# Patient Record
Sex: Male | Born: 2000 | Race: Black or African American | Hispanic: No | Marital: Single | Smoking: Current every day smoker
Health system: Southern US, Community
[De-identification: ages and names within clinical notes are randomized; demographics above are authoritative.]

## PROBLEM LIST (undated history)

## (undated) DIAGNOSIS — T07XXXA Unspecified multiple injuries, initial encounter: Secondary | ICD-10-CM

## (undated) DIAGNOSIS — S82842A Displaced bimalleolar fracture of left lower leg, initial encounter for closed fracture: Secondary | ICD-10-CM

---
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
## 2020-04-24 ENCOUNTER — Ambulatory Visit: Payer: 59 | Admitting: Neurology

## 2020-04-24 ENCOUNTER — Encounter: Payer: Self-pay | Admitting: Neurology

## 2020-04-24 NOTE — Progress Notes (Deleted)
[name]: [place] [name] DOB: 15-Mar-2001  REASON FOR VISIT: follow up HISTORY FROM: [name]  HISTORY OF PRESENT ILLNESS: Today 04/24/20  Nicholas [name] is an 19 year old male with history of single seizure-like event in November 2020.  EEG was normal.  MRI of Nicholas brain has yet to be completed.  HISTORY 10/22/2019 Dr. Anne Hahn: Nicholas [name] is an 19 year old left-handed black male with a history of an event that occurred on 08 October 2019.  Nicholas [name] had gotten up out of bed around 4 AM to use Nicholas bathroom, he was washing his hands afterwards and then suddenly blacked out.  His parents were at home, they heard him hit Nicholas floor and they ran in seconds after Nicholas fall.  They observed that [place] [name] was stiffened, his arms were straight and elevated, then Nicholas [name] went into a jerking event with arms.  N[place]olas [name] did not bite his tongue or lose control of Nicholas bowels or Nicholas bladder.  [place]olas [name] was not responding for about 5 minutes, he then suddenly jerked awake and started repetitively saying that he was "good".  Nicholas [name] has never had any blackout events previously.  He denies any prior history of a closed head injury or loss of consciousness.  There is no family history of seizures.  Nicholas [name] denies any headaches, vision changes, speech or swallowing problems.  N[place]olas [name] has not had any numbness or weakness of Nicholas face, arms, legs.  He denies balance issues or difficulty controlling Nicholas bowels or Nicholas bladder.  He went to [place] emergency room and a CT scan of Nicholas brain was unremarkable.  A urine drug screen was not done.  Nicholas [name] denies use of cocaine, he does use marijuana and he denies excessive drinking.  He is sent to this office for further evaluation.  REVIEW OF SYSTEMS: Out of a complete 14 system review of symptoms, Nicholas [name] complains only of Nicholas following symptoms, and all other reviewed systems are negative.  ALLERGIES: No Known Allergies  HOME MEDICATIONS: No  outpatient medications prior to visit.   No facility-administered medications prior to visit.    PAST MEDICAL HISTORY: Past Medical History:  Diagnosis Date  . Abrasions of multiple sites 05/28/2015   left arm, left knee  . Closed bimalleolar fracture of left ankle 05/28/2015  . Syncope and collapse 10/22/2019    PAST SURGICAL HISTORY: Past Surgical History:  Procedure Laterality Date  . ORIF ANKLE FRACTURE Left 06/02/2015   Procedure: OPEN REDUCTION INTERNAL FIXATION (ORIF) LEFT ANKLE FRACTURE;  Surgeon: Sheral Apley, MD;  Location: [facility];  Service: Orthopedics;  Laterality: Left;  ANESTHESIA:  GENERAL, ANKLE BLOCK    FAMILY HISTORY: Family History  Problem Relation Age of Onset  . Diabetes Father   . Hypertension Father     SOCIAL HISTORY: Social History   Socioeconomic History  . Marital status: Single    Spouse name: Not on file  . Number of children: Not on file  . Years of education: Not on file  . Highest education level: Not on file  Occupational History  . Not on file  Tobacco Use  . Smoking status: Never Smoker  . Smokeless tobacco: Never Used  Vaping Use  . Vaping Use: Former  . Quit date: 10/06/2019  Substance and Sexual Activity  . Alcohol use: No  . Drug use: Yes    Types: Marijuana    Comment: daily  . Sexual activity: Not on file  Other  Topics Concern  . 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     Social Determinants of Health   Financial Resource Strain:   . Difficulty of Paying Living Expenses:   Food Insecurity:   . Worried About Charity fundraiser in [place] Last Year:   . Arboriculturist in [place] Last Year:   Transportation Needs:   . Film/video editor (Medical):   Marland Kitchen Lack of Transportation (Non-Medical):   Physical Activity:   . Days of Exercise per Week:   . Minutes of Exercise per Session:   Stress:   . Feeling of Stress :   Social Connections:   .  Frequency of Communication with Friends and Family:   . Frequency of Social Gatherings with Friends and Family:   . Attends Religious Services:   . Active Member of Clubs or Organizations:   . Attends Archivist Meetings:   Marland Kitchen Marital Status:   Intimate Partner Violence:   . Fear of Current or Ex-Partner:   . Emotionally Abused:   Marland Kitchen Physically Abused:   . Sexually Abused:       PHYSICAL EXAM  There were no vitals filed for this visit. There is no height or weight on file to calculate BMI.  Generalized: Well developed, in no acute distress   Neurological examination  Mentation: Alert oriented to time, place, history taking. Follows all commands speech and language fluent Cranial nerve II-XII: Pupils were equal round reactive to light. Extraocular movements were full, visual field were full on confrontational test. Facial sensation and strength were normal. Uvula tongue midline. Head turning and shoulder shrug  were normal and symmetric. Motor: Nicholas motor testing reveals 5 over 5 strength of all 4 extremities. Good symmetric motor tone is noted throughout.  Sensory: Sensory testing is intact to soft touch on all 4 extremities. No evidence of extinction is noted.  Coordination: Cerebellar testing reveals good finger-nose-finger and heel-to-shin bilaterally.  Gait and station: Gait is normal. Tandem gait is normal. Romberg is negative. No drift is seen.  Reflexes: Deep tendon reflexes are symmetric and normal bilaterally.   DIAGNOSTIC DATA (LABS, IMAGING, TESTING) - I reviewed [name] records, labs, notes, testing and imaging myself where available.  Lab Results  Component Value Date   WBC 5.1 10/08/2019   HGB 16.2 10/08/2019   HCT 47.8 10/08/2019   MCV 87.7 10/08/2019   PLT 261 10/08/2019      Component Value Date/Time   NA 138 10/08/2019 0901   K 4.1 10/08/2019 0901   CL 105 10/08/2019 0901   CO2 26 10/08/2019 0901   GLUCOSE 98 10/08/2019 0901   BUN 10  10/08/2019 0901   CREATININE 1.07 10/08/2019 0901   CALCIUM 9.4 10/08/2019 0901   GFRNONAA >60 10/08/2019 0901   GFRAA >60 10/08/2019 0901   No results found for: CHOL, HDL, LDLCALC, LDLDIRECT, TRIG, CHOLHDL No results found for: HGBA1C No results found for: VITAMINB12 No results found for: TSH    ASSESSMENT AND PLAN 19 y.o. year old male  has a past medical history of Abrasions of multiple sites (05/28/2015), Closed bimalleolar fracture of left ankle (05/28/2015), and Syncope and collapse (10/22/2019). here with ***   I spent 15 minutes with [place] [name]. 50% of this time was spent   Butler Denmark, Fraser, DNP 04/24/2020, 5:23 AM [facility] [address] [place], [place] [address] [phone]

## 2021-05-07 IMAGING — CT CT HEAD W/O CM
3 series · 14 of 47 positions shown, 16 images · non-contrast
Comparison: None.

CLINICAL DATA: Fall secondary to syncope last night. Headache.

EXAM:
CT HEAD WITHOUT CONTRAST
TECHNIQUE: Contiguous axial images were obtained from the base of the skull
through the vertex without intravenous contrast.

[Series 2: head wo · axial · 0.47mm/px · z∈[-113,+12]mm · 8 of 30 slices shown, 10 images]
[im 3/30  brain]
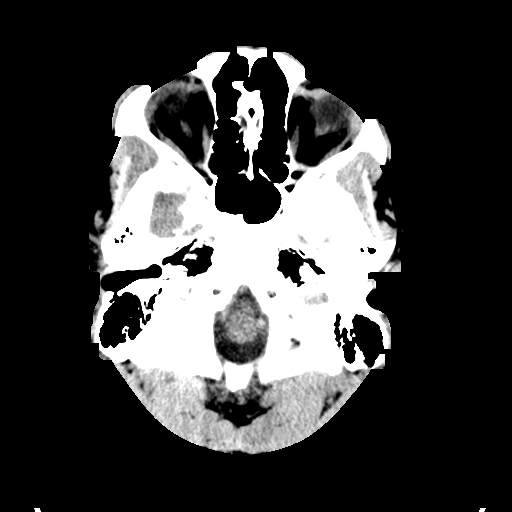
[im 3/30  bone]
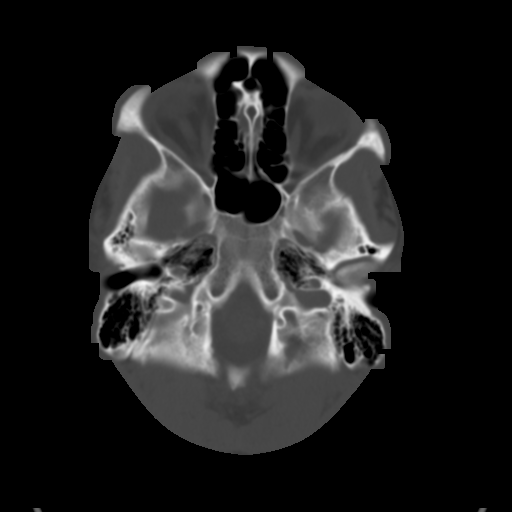
[im 7/30  brain]
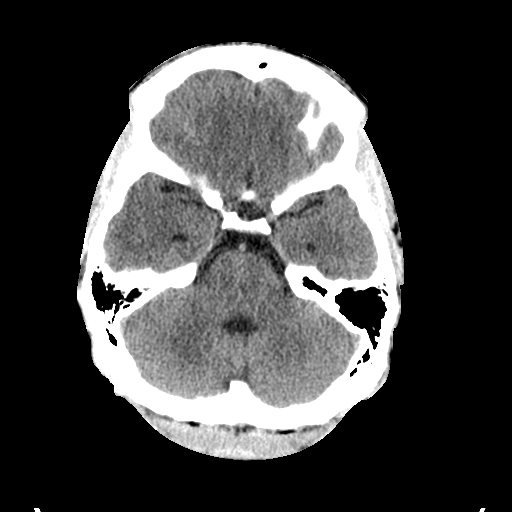
[im 10/30  brain]
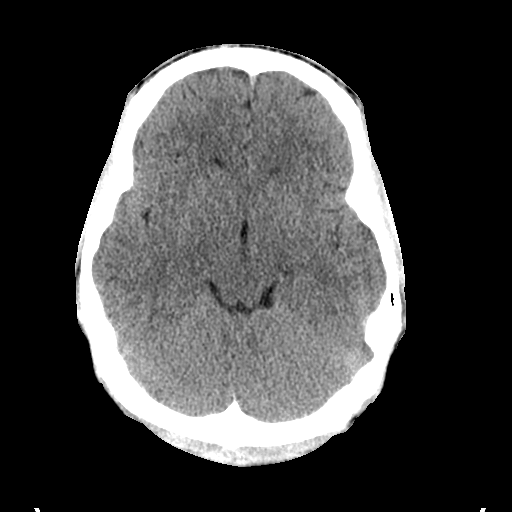
[im 14/30  brain]
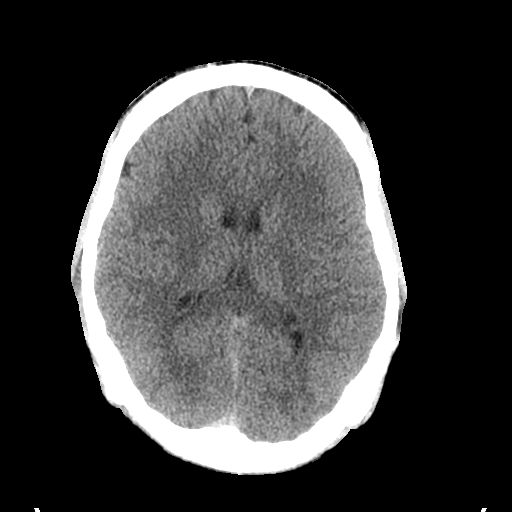
[im 17/30  brain]
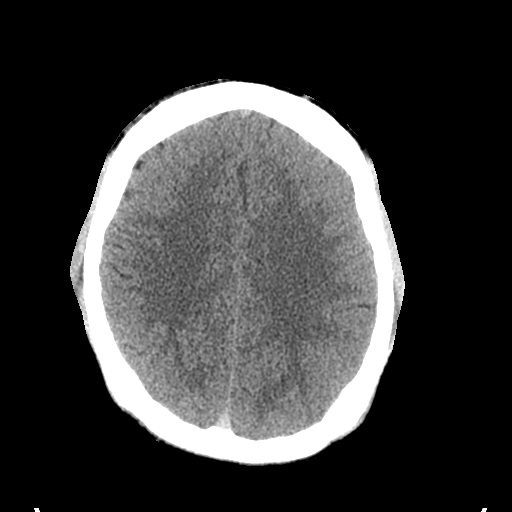
[im 17/30  bone]
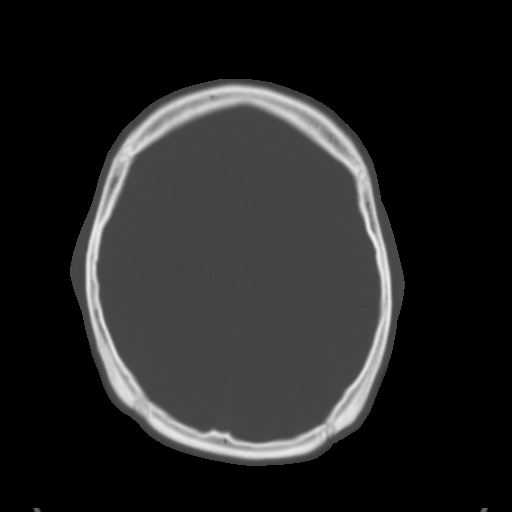
[im 21/30  brain]
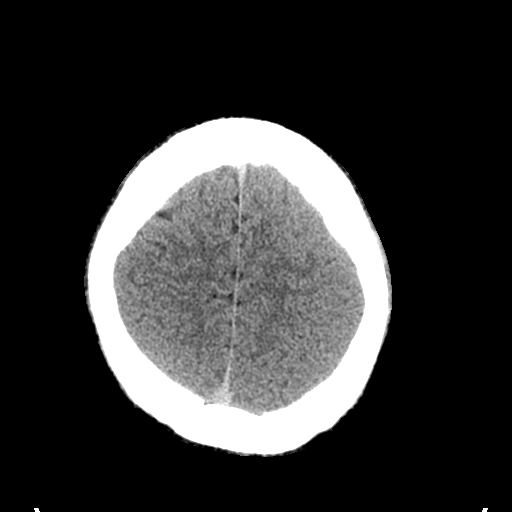
[im 24/30  brain]
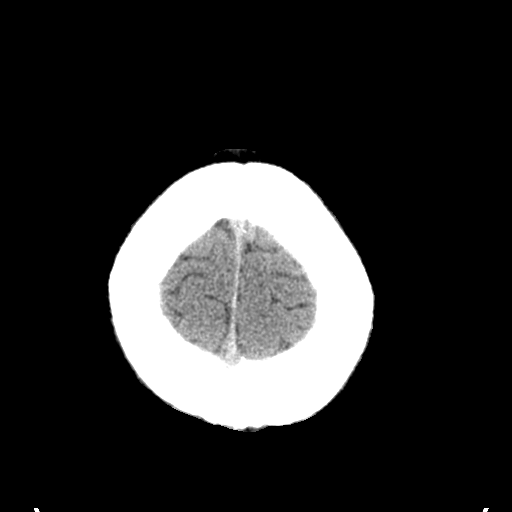
[im 28/30  brain]
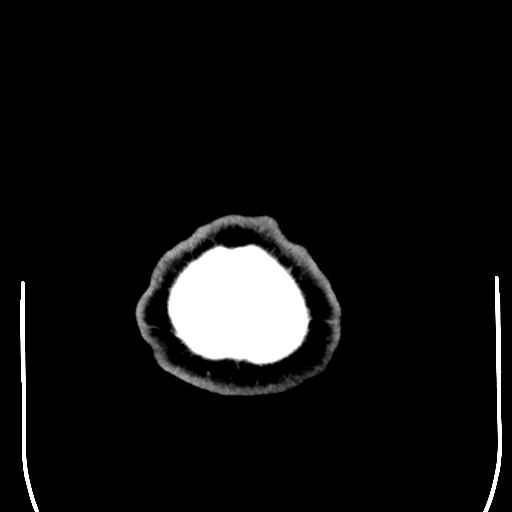

[Series 5: coronal soft tissue · coronal · 0.29mm/px · 3 of 84 slices shown]
[im 28/84  brain]
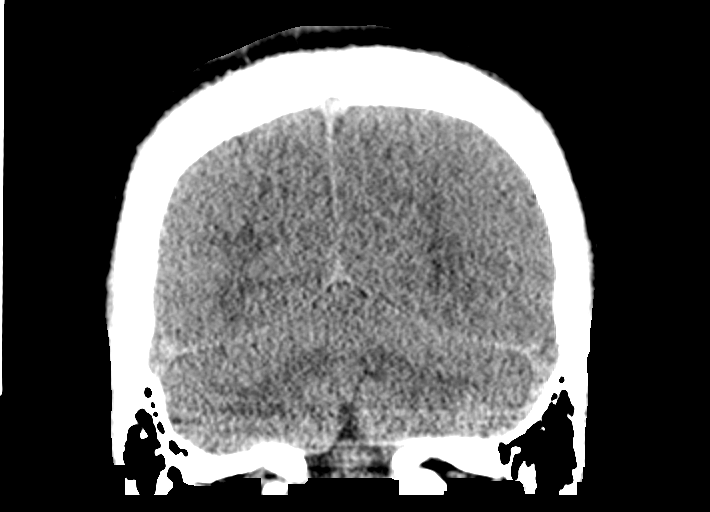
[im 37/84  brain]
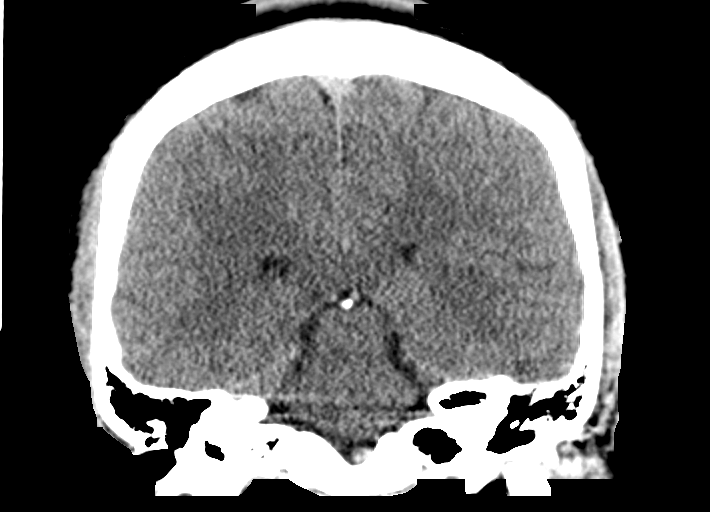
[im 47/84  brain]
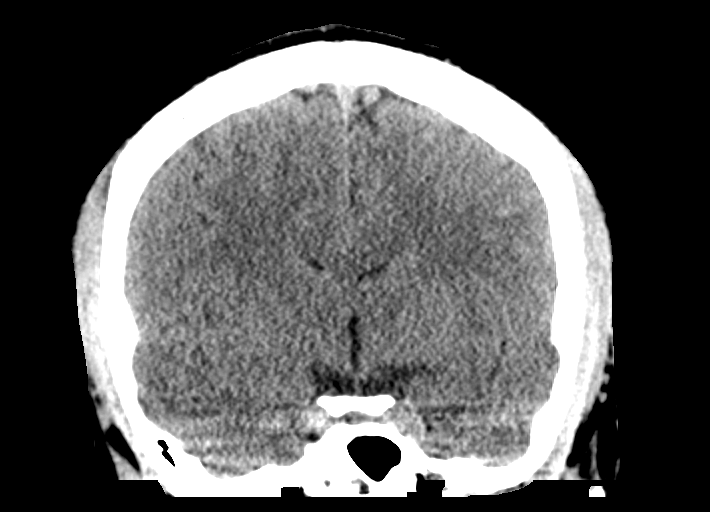

[Series 6: sagittal soft tissue · sagittal · 0.29mm/px · 3 of 66 slices shown]
[im 22/66  brain]
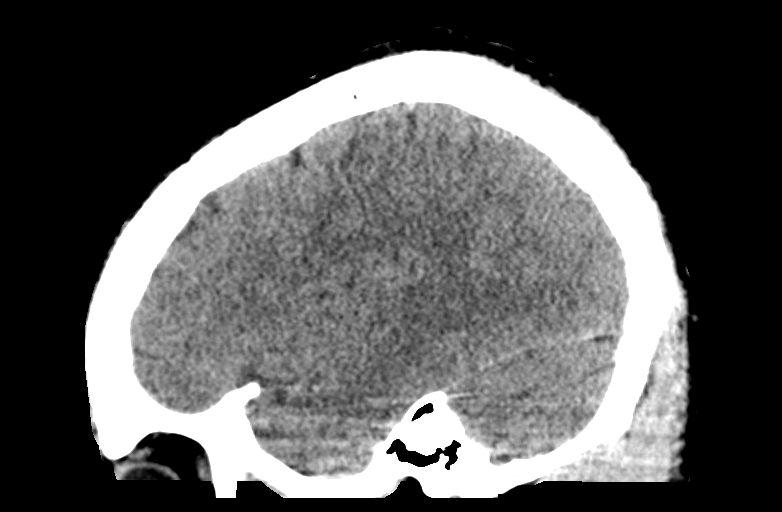
[im 33/66  brain]
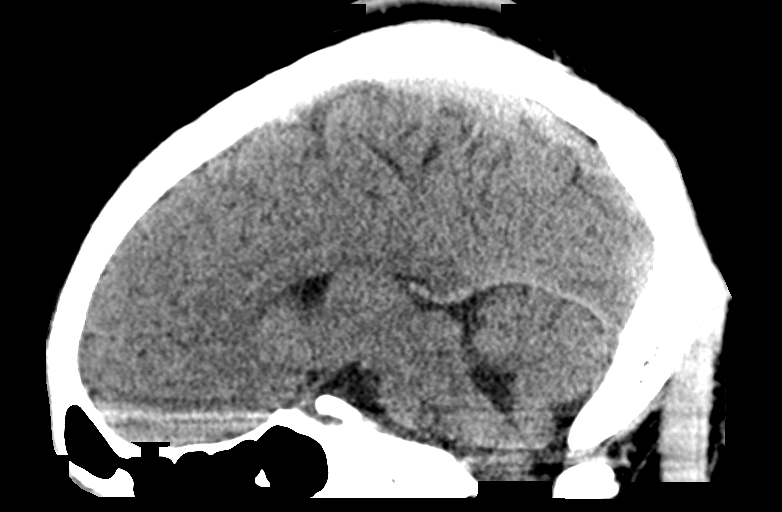
[im 44/66  brain]
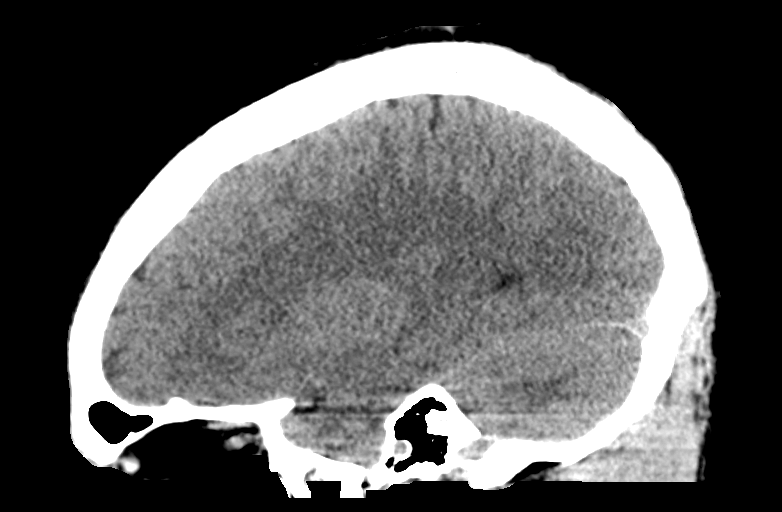

[14 of 47 positions shown; findings below may reference images not displayed]

FINDINGS: Brain: No evidence of acute infarction, hemorrhage, hydrocephalus,
extra-axial collection or mass lesion/mass effect.

Vascular: No hyperdense vessel or unexpected calcification.

Skull: Normal. Negative for fracture or focal lesion.

Sinuses/Orbits: Normal

Other: None
IMPRESSION: Normal exam.

## 2021-07-26 ENCOUNTER — Encounter (HOSPITAL_COMMUNITY): Payer: Self-pay

## 2021-07-26 ENCOUNTER — Emergency Department (HOSPITAL_COMMUNITY)
Admission: EM | Admit: 2021-07-26 | Discharge: 2021-07-26 | Disposition: A | Discharge status: Home / Self Care | Payer: 59 | Attending: Emergency Medicine | Admitting: Emergency Medicine

## 2021-07-26 ENCOUNTER — Other Ambulatory Visit: Payer: Self-pay

## 2021-07-26 ENCOUNTER — Ambulatory Visit (HOSPITAL_COMMUNITY): Admission: RE | Admit: 2021-07-26 | Payer: 59 | Source: Ambulatory Visit

## 2021-07-26 DIAGNOSIS — F17290 Nicotine dependence, other tobacco product, uncomplicated: Secondary | ICD-10-CM | POA: Diagnosis not present

## 2021-07-26 DIAGNOSIS — R072 Precordial pain: Secondary | ICD-10-CM | POA: Insufficient documentation

## 2021-07-26 LAB — COMPREHENSIVE METABOLIC PANEL
ALT: 16 U/L (ref 0–44)
AST: 16 U/L (ref 15–41)
Albumin: 4.1 g/dL (ref 3.5–5.0)
Alkaline Phosphatase: 62 U/L (ref 38–126)
Anion gap: 6 (ref 5–15)
BUN: 12 mg/dL (ref 6–20)
CO2: 24 mmol/L (ref 22–32)
Calcium: 9.2 mg/dL (ref 8.9–10.3)
Chloride: 107 mmol/L (ref 98–111)
Creatinine, Ser: 0.9 mg/dL (ref 0.61–1.24)
GFR, Estimated: >60 mL/min (ref >60)
Glucose, Bld: 86 mg/dL (ref 70–99)
Potassium: 4.1 mmol/L (ref 3.5–5.1)
Sodium: 137 mmol/L (ref 135–145)
Total Bilirubin: 0.8 mg/dL (ref 0.3–1.2)
Total Protein: 7.3 g/dL (ref 6.5–8.1)

## 2021-07-26 LAB — CBC WITH DIFFERENTIAL/PLATELET
Abs Immature Granulocytes: 0.02 10*3/uL (ref 0.00–0.07)
Basophils Absolute: 0 10*3/uL (ref 0.0–0.1)
Basophils Relative: 0 %
Eosinophils Absolute: 0.2 10*3/uL (ref 0.0–0.5)
Eosinophils Relative: 4 %
HCT: 48 % (ref 39.0–52.0)
Hemoglobin: 16.3 g/dL (ref 13.0–17.0)
Immature Granulocytes: 0 %
Lymphocytes Relative: 34 %
Lymphs Abs: 1.6 10*3/uL (ref 0.7–4.0)
MCH: 30 pg (ref 26.0–34.0)
MCHC: 34 g/dL (ref 30.0–36.0)
MCV: 88.2 fL (ref 80.0–100.0)
Monocytes Absolute: 0.5 10*3/uL (ref 0.1–1.0)
Monocytes Relative: 11 %
Neutro Abs: 2.4 10*3/uL (ref 1.7–7.7)
Neutrophils Relative %: 51 %
Platelets: 256 10*3/uL (ref 150–400)
RBC: 5.44 MIL/uL (ref 4.22–5.81)
RDW: 13 % (ref 11.5–15.5)
WBC: 4.8 10*3/uL (ref 4.0–10.5)
nRBC: 0 % (ref 0.0–0.2)

## 2021-07-26 LAB — LIPASE, BLOOD: Lipase: 45 U/L (ref 11–51)

## 2021-07-26 NOTE — ED Provider Notes (Signed)
[facility]-EMERGENCY DEPT Provider Note   CSN: [id number] Arrival date & time: 07/26/21  1405     History No chief complaint on file.   Nicholas [name] is a 20 y.o. male.  With past medical history of syncope presents emergency department with right-sided chest pain.  Right-sided chest pain that began this morning.  Describes it as intermittent.  Describes it as aching, worse with movement, improvement with rest.  He denies trauma to the chest.  Denies new activities or exercise.  Denies associated shortness of breath, cough, fevers, syncope, lightheadedness or dizziness, nausea, diaphoresis.  Denies family history of heart disease or sudden death.   HPI     Past Medical History:  Diagnosis Date   Abrasions of multiple sites 05/28/2015   left arm, left knee   Closed bimalleolar fracture of left ankle 05/28/2015   Syncope and collapse 10/22/2019    Patient Active Problem List   Diagnosis Date Noted   Syncope and collapse 10/22/2019    Past Surgical History:  Procedure Laterality Date   ORIF ANKLE FRACTURE Left 06/02/2015   Procedure: OPEN REDUCTION INTERNAL FIXATION (ORIF) LEFT ANKLE FRACTURE;  Surgeon: Sheral Apley, MD;  Location: [facility];  Service: Orthopedics;  Laterality: Left;  ANESTHESIA:  GENERAL, ANKLE BLOCK       Family History  Problem Relation Age of Onset   Diabetes Father    Hypertension Father     Social History   Tobacco Use   Smoking status: Every Day    Types: Cigars   Smokeless tobacco: Never  Vaping Use   Vaping Use: Some days   Last attempt to quit: 10/06/2019   Substances: Nicotine, Flavoring  Substance Use Topics   Alcohol use: No   Drug use: Yes    Types: Marijuana    Comment: daily    Home Medications Prior to Admission medications   Not on File    Allergies    Patient has no known allergies.  Review of Systems   Review of Systems  Constitutional:  Negative for diaphoresis,  fatigue and fever.  Respiratory:  Negative for cough, chest tightness and shortness of breath.   Cardiovascular:  Positive for chest pain. Negative for palpitations and leg swelling.  Gastrointestinal:  Negative for abdominal pain and nausea.  Musculoskeletal:  Positive for myalgias.  Neurological:  Negative for dizziness, syncope, light-headedness and headaches.  All other systems reviewed and are negative.  Physical Exam Updated Vital Signs BP 139/87 (BP Location: Left Arm)   Pulse 61   Temp 98 F (36.7 C) (Oral)   Resp 16   Ht 5\' 7"  (1.702 m)   Wt 79.4 kg   SpO2 96%   BMI 27.41 kg/m   Physical Exam Vitals and nursing note reviewed.  Constitutional:      General: He is not in acute distress.    Appearance: Normal appearance. He is not diaphoretic.  HENT:     Head: Normocephalic and atraumatic.     Mouth/Throat:     Mouth: Mucous membranes are moist.     Pharynx: Oropharynx is clear.  Eyes:     General: No scleral icterus.    Extraocular Movements: Extraocular movements intact.     Pupils: Pupils are equal, round, and reactive to light.  Cardiovascular:     Rate and Rhythm: Bradycardia present. Rhythm irregular.     Pulses: Normal pulses.          Radial pulses are 2+  on the right side and 2+ on the left side.     Heart sounds: Normal heart sounds.  Pulmonary:     Effort: Pulmonary effort is normal. No respiratory distress.     Breath sounds: Normal breath sounds.  Chest:     Chest wall: Tenderness present. No swelling or edema.    Abdominal:     General: Abdomen is flat. Bowel sounds are normal.     Palpations: Abdomen is soft.  Musculoskeletal:        General: Normal range of motion.     Cervical back: Normal range of motion and neck supple.     Right lower leg: No edema.     Left lower leg: No edema.  Skin:    General: Skin is warm and dry.     Capillary Refill: Capillary refill takes less than 2 seconds.     Findings: No rash.  Neurological:      General: No focal deficit present.     Mental Status: He is alert and oriented to person, place, and time. Mental status is at baseline.  Psychiatric:        Mood and Affect: Mood normal.        Behavior: Behavior normal.        Thought Content: Thought content normal.        Judgment: Judgment normal.   ED Results / Procedures / Treatments   Labs (all labs ordered are listed, but only abnormal results are displayed) Labs Reviewed  COMPREHENSIVE METABOLIC PANEL  CBC WITH DIFFERENTIAL/PLATELET  LIPASE, BLOOD   EKG None  Radiology No results found.  Procedures Procedures   Medications Ordered in ED Medications - No data to display  ED Course  I have reviewed the triage vital signs and the nursing notes.  Pertinent labs & imaging results that were available during my care of the patient were reviewed by me and considered in my medical decision making (see chart for details).    MDM Rules/Calculators/A&P [place] [name] is a 20 year old male with past medical history, HPI, physical exam as described above.  Presents with chest pain.  Labs unremarkable EKG is without signs of ischemia or infarction so doubt ACS. Presentation is not consistent with acute PE.  PERC negative. He has no infectious symptoms concerning for myocarditis or pericarditis. Auscultating clear breath sounds bilaterally so doubt pneumothorax. Patient declined chest x-ray  Presentation is most consistent with musculoskeletal pain as it is reproducible on exam.  He is hemodynamically stable and well-appearing on exam.  I have discussed the findings with him and answered all of his questions.  I have discussed return precautions for him.  He understands instruction.  He is safe for discharge at this time  Final Clinical Impression(s) / ED Diagnoses Final diagnoses:  Precordial chest pain    Rx / DC Orders ED Discharge Orders     None        Cristopher Peru, PA-C 07/26/21 1932    Gloris Manchester,  MD 07/27/21 1529

## 2021-07-26 NOTE — Discharge Instructions (Addendum)
You are seen in the emergency department today for having chest pain.  All of your lab work was reassuring.  Your EKG was also reassuring.  Spoke in the room about risk and benefit of you staying for your chest x-ray.  Is unlikely that the chest x-ray will show any abnormalities.  Your description of your chest pain is likely chest wall pain meaning the muscle in your chest may be strained.  Please use ibuprofen and Tylenol as needed for pain.  You may also do some gentle range of motion until the pain resolves.  I have put the number for community health and wellness on your discharge paperwork.  Please follow-up with them if you continue to have the same pain.  You may return to emergency department for increasing chest pain is associate with shortness of breath, palpitations, dizziness, lightheadedness, passing out, or sweating.

## 2021-07-26 NOTE — ED Provider Notes (Signed)
Emergency Medicine Provider Triage Evaluation Note  Nicholas [name] , a 20 y.o. male  was evaluated in triage.  Pt complains of Right sided chest pain since he woke up this morning.  He denies any shortness of breath.  No fevers.  The right sided chest pain is worse with movement.  No new activities or exercise.   Review of Systems  Positive: Right sided chest pain Negative: Fevers, shob.   Physical Exam  BP 139/87 (BP Location: Left Arm)   Pulse 61   Temp 98 F (36.7 C) (Oral)   Resp 16   Ht 5\' 7"  (1.702 m)   Wt 79.4 kg   SpO2 96%   BMI 27.41 kg/m  Gen:   Awake, no distress   Resp:  Normal effort  MSK:   Moves extremities without difficulty  Other:  Irregular rhythm, bradycardia.   Medical Decision Making  Medically screening exam initiated at 3:14 PM.  Appropriate orders placed.  Nicholas [name] was informed that the remainder of the evaluation will be completed by another provider, this initial triage assessment does not replace that evaluation, and the importance of remaining in the ED until their evaluation is complete.  On the monitor patient is having irregular rhythm with intermittent apparent loss of P waves.  Will order labs for electrolytes including lipase and CMP given the right sided pain nature.    Nicholas [name] 07/26/21 1525    07/28/21, [place] [name] 07/28/21 1146

## 2021-07-26 NOTE — ED Triage Notes (Signed)
Patient reports that he has right chest soreness when palpated. Patient states soreness worse when he twists his body or moves his right arm. Patient denies any SOB, chest injury, heavy lifting.
